# Patient Record
Sex: Male | Born: 1960 | Race: Black or African American | Hispanic: No | Marital: Single | Smoking: Never smoker
Health system: Southern US, Community
[De-identification: ages and names within clinical notes are randomized; demographics above are authoritative.]

## PROBLEM LIST (undated history)

## (undated) DIAGNOSIS — I10 Essential (primary) hypertension: Secondary | ICD-10-CM

## (undated) DIAGNOSIS — J45909 Unspecified asthma, uncomplicated: Secondary | ICD-10-CM

---

## 2008-01-20 ENCOUNTER — Emergency Department (HOSPITAL_COMMUNITY): Admission: EM | Admit: 2008-01-20 | Discharge: 2008-01-20 | Payer: Self-pay | Admitting: Family Medicine

## 2008-03-15 ENCOUNTER — Emergency Department (HOSPITAL_COMMUNITY): Admission: EM | Admit: 2008-03-15 | Discharge: 2008-03-15 | Payer: Self-pay | Admitting: Emergency Medicine

## 2009-03-08 ENCOUNTER — Emergency Department (HOSPITAL_COMMUNITY): Admission: EM | Admit: 2009-03-08 | Discharge: 2009-03-08 | Payer: Self-pay | Admitting: Family Medicine

## 2013-01-12 ENCOUNTER — Encounter (HOSPITAL_COMMUNITY): Payer: Self-pay | Admitting: Emergency Medicine

## 2013-01-12 ENCOUNTER — Emergency Department (HOSPITAL_COMMUNITY)
Admission: EM | Admit: 2013-01-12 | Discharge: 2013-01-12 | Disposition: A | Discharge status: Home / Self Care | Payer: Self-pay | Attending: Emergency Medicine | Admitting: Emergency Medicine

## 2013-01-12 ENCOUNTER — Emergency Department (HOSPITAL_COMMUNITY): Payer: Self-pay

## 2013-01-12 DIAGNOSIS — I10 Essential (primary) hypertension: Secondary | ICD-10-CM | POA: Insufficient documentation

## 2013-01-12 DIAGNOSIS — R0602 Shortness of breath: Secondary | ICD-10-CM

## 2013-01-12 DIAGNOSIS — R9431 Abnormal electrocardiogram [ECG] [EKG]: Secondary | ICD-10-CM

## 2013-01-12 DIAGNOSIS — R05 Cough: Secondary | ICD-10-CM | POA: Insufficient documentation

## 2013-01-12 DIAGNOSIS — R059 Cough, unspecified: Secondary | ICD-10-CM | POA: Insufficient documentation

## 2013-01-12 DIAGNOSIS — J45901 Unspecified asthma with (acute) exacerbation: Secondary | ICD-10-CM | POA: Insufficient documentation

## 2013-01-12 DIAGNOSIS — I517 Cardiomegaly: Secondary | ICD-10-CM

## 2013-01-12 HISTORY — DX: Essential (primary) hypertension: I10

## 2013-01-12 HISTORY — DX: Unspecified asthma, uncomplicated: J45.909

## 2013-01-12 LAB — CBC WITH DIFFERENTIAL/PLATELET
Eosinophils Absolute: 0.2 10*3/uL (ref 0.0–0.7)
Eosinophils Relative: 3 % (ref 0–5)
HCT: 44.7 % (ref 39.0–52.0)
Hemoglobin: 15.6 g/dL (ref 13.0–17.0)
Lymphs Abs: 1.6 10*3/uL (ref 0.7–4.0)
MCH: 29.8 pg (ref 26.0–34.0)
MCHC: 34.9 g/dL (ref 30.0–36.0)
MCV: 85.5 fL (ref 78.0–100.0)
Monocytes Absolute: 0.5 10*3/uL (ref 0.1–1.0)
Monocytes Relative: 5 % (ref 3–12)
RBC: 5.23 MIL/uL (ref 4.22–5.81)

## 2013-01-12 LAB — BASIC METABOLIC PANEL
BUN: 13 mg/dL (ref 6–23)
Calcium: 10 mg/dL (ref 8.4–10.5)
Creatinine, Ser: 0.91 mg/dL (ref 0.50–1.35)
GFR calc non Af Amer: >90 mL/min (ref >90)
Glucose, Bld: 96 mg/dL (ref 70–99)
Potassium: 3.5 mEq/L (ref 3.5–5.1)

## 2013-01-12 LAB — POCT I-STAT TROPONIN I

## 2013-01-12 MED ORDER — ASPIRIN 81 MG PO CHEW
324.0000 mg | CHEWABLE_TABLET | Freq: Once | ORAL | Status: AC
  Administered 2013-01-12: 324 mg via ORAL
  Filled 2013-01-12: qty 4

## 2013-01-12 MED ORDER — ALBUTEROL SULFATE (5 MG/ML) 0.5% IN NEBU
2.5000 mg | INHALATION_SOLUTION | Freq: Once | RESPIRATORY_TRACT | Status: AC
  Administered 2013-01-12: 2.5 mg via RESPIRATORY_TRACT
  Filled 2013-01-12: qty 0.5

## 2013-01-12 MED ORDER — ALBUTEROL SULFATE HFA 108 (90 BASE) MCG/ACT IN AERS
2.0000 | INHALATION_SPRAY | Freq: Once | RESPIRATORY_TRACT | Status: AC
  Administered 2013-01-12: 2 via RESPIRATORY_TRACT
  Filled 2013-01-12: qty 6.7

## 2013-01-12 MED ORDER — METHYLPREDNISOLONE SODIUM SUCC 125 MG IJ SOLR
125.0000 mg | Freq: Once | INTRAMUSCULAR | Status: AC
  Administered 2013-01-12: 125 mg via INTRAMUSCULAR

## 2013-01-12 MED ORDER — METHYLPREDNISOLONE SODIUM SUCC 125 MG IJ SOLR
125.0000 mg | Freq: Once | INTRAMUSCULAR | Status: DC
  Filled 2013-01-12: qty 2

## 2013-01-12 MED ORDER — NITROGLYCERIN 0.4 MG SL SUBL
0.4000 mg | SUBLINGUAL_TABLET | SUBLINGUAL | Status: DC | PRN
  Filled 2013-01-12: qty 25

## 2013-01-12 MED ORDER — PREDNISONE 20 MG PO TABS: 40.0000 mg | ORAL_TABLET | Freq: Every day | ORAL | Status: AC

## 2013-01-12 NOTE — ED Notes (Signed)
Patient reports slow improvement of the SOB after the nebulizing treatment.

## 2013-01-12 NOTE — ED Provider Notes (Signed)
CSN: [id number]     Arrival date & time 01/12/13  1478 History     First MD Initiated Contact with Patient 01/12/13 0827     Chief Complaint  Patient presents with  . Shortness of Breath   (Consider location/radiation/quality/duration/timing/severity/associated sxs/prior Treatment) HPI Comments: Patient is a 52 year old male with a past medical history of asthma and hypertension who presents with a 3 day history of SOB. Symptoms started gradually and remained intermittent since the onset. Patient denies any known trigger for the SOB. He tried his albuterol inhaler at home for the SOB which initially provided relief and then no longer helped with his symptoms after subsequent uses. Patient reports associated dry cough. No aggravating/alleviating factors. Patient denies recent surgery, recent travel, history of DVT/PE. Patient denies chest pain or any other associated symptoms.    Past Medical History  Diagnosis Date  . Asthma   . Hypertension    History reviewed. No pertinent past surgical history. History reviewed. No pertinent family history. History  Substance Use Topics  . Smoking status: Never Smoker   . Smokeless tobacco: Not on file  . Alcohol Use: Yes     Comment: occ    Review of Systems  Respiratory: Positive for cough and shortness of breath.   All other systems reviewed and are negative.    Allergies  Review of patient's allergies indicates no known allergies.  Home Medications  No current outpatient prescriptions on file. BP 160/79  Pulse 95  Temp(Src) 98.1 F (36.7 C) (Oral)  Resp 18  SpO2 96% Physical Exam  Nursing note and vitals reviewed. Constitutional: He is oriented to person, place, and time. He appears well-developed and well-nourished. No distress.  HENT:  Head: Normocephalic and atraumatic.  Eyes: Conjunctivae and EOM are normal.  Neck: Normal range of motion.  Cardiovascular: Normal rate and regular rhythm.  Exam reveals no gallop and no  friction rub.   No murmur heard. Pulmonary/Chest: Effort normal and breath sounds normal. He has no wheezes. He has no rales. He exhibits no tenderness.  Abdominal: Soft. He exhibits no distension. There is no tenderness. There is no rebound and no guarding.  Musculoskeletal: Normal range of motion.  Neurological: He is alert and oriented to person, place, and time. Coordination normal.  Speech is goal-oriented. Moves limbs without ataxia.   Skin: Skin is warm and dry.  Psychiatric: He has a normal mood and affect. His behavior is normal.    ED Course   Procedures (including critical care time)   Date: 01/12/2013  Rate: 96  Rhythm: normal sinus rhythm  QRS Axis: normal  Intervals: normal  ST/T Wave abnormalities: nonspecific ST/T changes  Conduction Disutrbances:none  Narrative Interpretation: NSR with ST/T wave changes noted  Old EKG Reviewed: none available   Labs Reviewed  CBC WITH DIFFERENTIAL  BASIC METABOLIC PANEL  D-DIMER, QUANTITATIVE  POCT I-STAT TROPONIN I   Dg Chest 2 View  01/12/2013   *RADIOLOGY REPORT*  Clinical Data: Shortness of breath  CHEST - 2 VIEW  Comparison: None.  Findings:  Lungs are clear.  Heart size and pulmonary vascularity are normal.  No adenopathy.  No bone lesions.  IMPRESSION: No abnormality noted.   Original Report Authenticated By: Bretta Bang, M.D.   1. SOB (shortness of breath)   2. LVH (left ventricular hypertrophy)     MDM  8:33 AM Labs, chest xray, d-dimer pending. Patient will have ASA and nitro here. Patient will also have albuterol nebulizer. Vitals stable  and patient afebrile.   11:40 AM Chest xray, d-dimer, troponin unremarkable. Patient will have albuterol inhaler here to take home and solumedrol injection. Patient has EKG abnormalities that are concerning for ischemia such as ST/T wave changes. I spoke with [facility] who will see the patient.   1:08 PM Dr. Elease Hashimoto saw the patient who thinks the SOB is asthma  related. Dr. Nehemiah Settle faxed over an old EKG from the office which looks the same as the EKG now. I will discharge the patient with prednisone pack and instructions to follow up with Dr. Nehemiah Settle. Vitals stable and patient afebrile. Patient asymptomatic at this time.     Emilia Beck, PA-C 01/12/13 1311

## 2013-01-12 NOTE — ED Notes (Signed)
Patient said he started having SOB on Sunday but it was not bad.  It has gotten progressively worse so he decided to come in.  The patient does have asthma, however, he advised that he has not used his inhaler in a year.  He says he has had to use it since Sunday.  Patient denies productive cough or any pain.

## 2013-01-12 NOTE — ED Notes (Signed)
Patient is alert and orientedx4.  Patient was explained discharge instructions and they understood them with no questions.   

## 2013-01-12 NOTE — Consult Note (Signed)
CONSULT NOTE  Date: 01/12/2013               Patient Name:  Tony Glenn MRN: [id number]  DOB: 03/31/1961 Age / Sex: 52 y.o., male        PCP: No primary provider on file. Primary Cardiologist: New to [place]            Referring Physician: Eden Emms, MD    Primary MD is Ron Nehemiah Settle          Reason for Consult: Abnormal ECG, dyspnea.           History of Present Illness: Patient is a 52 y.o. male with a PMHx of asthma and hypertension, who was seen in the [facility] emergency room on 01/12/2013 for evaluation of shortness of breath and abnormal EKG.  He has been told that his ECG suggests that he has LVH.  Pt has been on HCTZ for HTN for 3 years ago.  He take asthma inhalers PRN.   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    No chest pain.  He does notice some pleuretic discomfort that he noticed today.  Exercises regularly, weights and walking.  No CP with exercise.  He has received several inhalers, and IV solumedrol and is better but not 100%,    No family hx of cardiac problems.  Non smoker  Drinks ETOH occasionally.    Medications: Outpatient medications:  (Not in a hospital admission)  Current medications: No current facility-administered medications for this encounter.   Current Outpatient Prescriptions  Medication Sig Dispense Refill  . albuterol (PROVENTIL HFA;VENTOLIN HFA) 108 (90 BASE) MCG/ACT inhaler Inhale 2 puffs into the lungs every 6 (six) hours as needed for wheezing.      . fish oil-omega-3 fatty acids 1000 MG capsule Take 2 g by mouth daily.      . hydrochlorothiazide (HYDRODIURIL) 25 MG tablet Take 25 mg by mouth daily.      . Multiple Vitamins-Minerals (MULTIVITAMIN WITH MINERALS) tablet Take 1 tablet by mouth daily.      Marland Kitchen SALINE NA Place 2 sprays into both nostrils as needed (congestion).         No Known Allergies   Past Medical History  Diagnosis  Date  . Asthma   . Hypertension     History reviewed. No pertinent past surgical history.  History reviewed. No pertinent family history.  Social History:  reports that he has never smoked. He does not have any smokeless tobacco history on file. He reports that  drinks alcohol. He reports that he does not use illicit drugs.   Review of Systems: Constitutional:  denies fever, chills, diaphoresis, appetite change and fatigue.  HEENT: admits to  Cough and sneezing starting yesterday,  Denies any neck stiffness and tinnitus.  Respiratory: admits to SOB, DOE, cough, chest tightness, and wheezing.  Cardiovascular: denies chest pain, palpitations and leg swelling.  Gastrointestinal: denies nausea, vomiting, abdominal pain, diarrhea, constipation, blood in stool.  Genitourinary: denies dysuria, urgency, frequency, hematuria, flank pain and difficulty urinating.  He does notice some increased urination after taking his HCTZ  Musculoskeletal: denies  myalgias, back pain, joint swelling, arthralgias and gait problem.   Skin: denies pallor, rash and wound.  Neurological: denies dizziness, seizures, syncope, weakness, light-headedness, numbness and headaches.   Hematological: denies adenopathy, easy bruising, personal or family bleeding history.  Psychiatric/ Behavioral: denies  suicidal ideation, mood changes, confusion, nervousness, sleep disturbance and agitation.    Physical Exam: BP 101/77  Pulse 82  Temp(Src) 98.1 F (36.7 C) (Oral)  Resp 13  SpO2 97%  General: Vital signs reviewed and noted. Well-developed, well-nourished, in no acute distress; alert, appropriate and cooperative throughout examination.  Head: Normocephalic, atraumatic, sclera anicteric, mucus membranes are moist  Neck: Supple. Negative for carotid bruits. JVD not elevated.  Lungs:  Mild wheezes  Heart: RRR with S1 S2. No murmurs, rubs, or gallops appreciated.  Abdomen:  Soft, non-tender, non-distended with  normoactive bowel sounds. No hepatomegaly. No rebound/guarding. No obvious abdominal masses  MSK: Strength and the appear normal for age.  Extremities: No clubbing or cyanosis. No edema.  Distal pedal pulses are 2+ and equal bilaterally.  Neurologic: Alert and oriented X 3. Moves all extremities spontaneously.  Psych: Responds to questions appropriately with a normal affect.    Lab results: Basic Metabolic Panel:  Recent Labs Lab 01/12/13 1000  NA 139  K 3.5  CL 103  CO2 25  GLUCOSE 96  BUN 13  CREATININE 0.91  CALCIUM 10.0    Liver Function Tests: No results found for this basename: AST, ALT, ALKPHOS, BILITOT, PROT, ALBUMIN,  in the last 168 hours No results found for this basename: LIPASE, AMYLASE,  in the last 168 hours No results found for this basename: AMMONIA,  in the last 168 hours  CBC:  Recent Labs Lab 01/12/13 1000  WBC 8.6  NEUTROABS 6.3  HGB 15.6  HCT 44.7  MCV 85.5  PLT 241    Cardiac Enzymes: No results found for this basename: CKTOTAL, CKMB, CKMBINDEX, TROPONINI,  in the last 168 hours  BNP: No components found with this basename: POCBNP,   CBG: No results found for this basename: GLUCAP,  in the last 168 hours  Coagulation Studies: No results found for this basename: LABPROT, INR,  in the last 72 hours   Other results:  EKG: Normal sinus rhythm at 96 beats a minute. He has voltage criteria for left ventricular hypertrophy with repolarization and 90. The old EKG from Dr. Warnell Forester office reveals similar abnormalities   Imaging: Dg Chest 2 View  01/12/2013   *RADIOLOGY REPORT*  Clinical Data: Shortness of breath  CHEST - 2 VIEW  Comparison: None.  Findings:  Lungs are clear.  Heart size and pulmonary vascularity are normal.  No adenopathy.  No bone lesions.  IMPRESSION: No abnormality noted.   Original Report Authenticated By: Bretta Bang, M.D.      Assessment & Plan: 1. Abnormal EKG during her as not having any significant episodes  of chest pain. He does have shortness of breath due to asthma. His EKG is suggestive of left ventricular hypertrophy with repolarization irregularities.  The old ECG from Dr. Lynnette Caffey office shows similar changes.   I suspect that this had hypertension for quite some time.  At present is not having any symptoms of unstable angina leading to his current complaints of dyspnea or just due to his asthma.  History total level is negative despite having symptoms since this past weekend.  This but I think that he is stable for discharge. We will provide him my name and office number. I've encouraged him to call me if he has any change in his symptoms or if he develops episodes of chest discomfort with it exertion.  At present he is able to exercise on a regular basis and has never had any episodes of chest pain.  I discussed the case with Dr. Nehemiah Settle. He'll be happy to see him in followup.    Vesta Mixer, Montez Hageman., MD, [facility] 01/12/2013, 12:48 PM Office - [phone] Pager [phone]

## 2013-01-12 NOTE — ED Notes (Signed)
Pt. Currently in XRAY, plan to give aerosol as soon as returned.

## 2013-01-12 NOTE — ED Notes (Signed)
Patient is resting comfortably. 

## 2013-01-12 NOTE — ED Notes (Signed)
Patient transported to X-ray 

## 2013-01-12 NOTE — ED Notes (Signed)
Pt. given 2 puffs from Albuterol Inhaler, one with spacer per order and educated along with exit care handouts about use of both.

## 2013-01-12 NOTE — ED Notes (Signed)
Pt c/o intermittent SOB x 3 days; pt sts some cough

## 2013-01-13 NOTE — ED Provider Notes (Signed)
Medical screening examination/treatment/procedure(s) were performed by non-physician practitioner and as supervising physician I was immediately available for consultation/collaboration.   Akaylah Lalley M Gennesis Hogland, DO 01/13/13 2219 

## 2013-01-20 ENCOUNTER — Emergency Department (HOSPITAL_COMMUNITY)
Admission: EM | Admit: 2013-01-20 | Discharge: 2013-01-20 | Disposition: A | Discharge status: Home / Self Care | Payer: Self-pay | Attending: Emergency Medicine | Admitting: Emergency Medicine

## 2013-01-20 ENCOUNTER — Encounter (HOSPITAL_COMMUNITY): Payer: Self-pay | Admitting: Emergency Medicine

## 2013-01-20 DIAGNOSIS — J45901 Unspecified asthma with (acute) exacerbation: Secondary | ICD-10-CM | POA: Insufficient documentation

## 2013-01-20 DIAGNOSIS — R059 Cough, unspecified: Secondary | ICD-10-CM | POA: Insufficient documentation

## 2013-01-20 DIAGNOSIS — J45909 Unspecified asthma, uncomplicated: Secondary | ICD-10-CM

## 2013-01-20 DIAGNOSIS — Z79899 Other long term (current) drug therapy: Secondary | ICD-10-CM | POA: Insufficient documentation

## 2013-01-20 DIAGNOSIS — I10 Essential (primary) hypertension: Secondary | ICD-10-CM | POA: Insufficient documentation

## 2013-01-20 DIAGNOSIS — R05 Cough: Secondary | ICD-10-CM | POA: Insufficient documentation

## 2013-01-20 MED ORDER — ALBUTEROL SULFATE (5 MG/ML) 0.5% IN NEBU
5.0000 mg | INHALATION_SOLUTION | Freq: Once | RESPIRATORY_TRACT | Status: AC
  Administered 2013-01-20: 5 mg via RESPIRATORY_TRACT
  Filled 2013-01-20: qty 1

## 2013-01-20 MED ORDER — ALBUTEROL SULFATE HFA 108 (90 BASE) MCG/ACT IN AERS
2.0000 | INHALATION_SPRAY | RESPIRATORY_TRACT | Status: DC | PRN
  Administered 2013-01-20: 2 via RESPIRATORY_TRACT
  Filled 2013-01-20: qty 6.7

## 2013-01-20 NOTE — ED Provider Notes (Signed)
CSN: [id number]     Arrival date & time 01/20/13  0112 History     First MD Initiated Contact with Patient 01/20/13 [phone]     Chief Complaint  Patient presents with  . Shortness of Breath  . Cough   (Consider location/radiation/quality/duration/timing/severity/associated sxs/prior Treatment) Patient is a 52 y.o. male presenting with shortness of breath and cough.  Shortness of Breath Associated symptoms: cough   Cough Associated symptoms: shortness of breath    Pt with remote history of asthma has had about a week of SOB and wheezing. He was seen in the ED 6 days ago for same and had extensive workup including labs, CXR, EKG etc. Was given HFA and steroids with good improvement until yesterday when he was at work in a dusty, moldy environment and felt more wheezing and tightness. He was given a neb in triage and feeling much better now. States he is almost out of HFA given at recent visit. Also has finished his steroid course.  Past Medical History  Diagnosis Date  . Asthma   . Hypertension    History reviewed. No pertinent past surgical history. No family history on file. History  Substance Use Topics  . Smoking status: Never Smoker   . Smokeless tobacco: Not on file  . Alcohol Use: Yes     Comment: occ    Review of Systems  Respiratory: Positive for cough and shortness of breath.    All other systems reviewed and are negative except as noted in HPI.   Allergies  Review of patient's allergies indicates no known allergies.  Home Medications   Current Outpatient Rx  Name  Route  Sig  Dispense  Refill  . albuterol (PROVENTIL HFA;VENTOLIN HFA) 108 (90 BASE) MCG/ACT inhaler   Inhalation   Inhale 2 puffs into the lungs every 6 (six) hours as needed for wheezing.         . fish oil-omega-3 fatty acids 1000 MG capsule   Oral   Take 2 g by mouth daily.         . hydrochlorothiazide (HYDRODIURIL) 25 MG tablet   Oral   Take 25 mg by mouth daily.         . Multiple  Vitamins-Minerals (MULTIVITAMIN WITH MINERALS) tablet   Oral   Take 1 tablet by mouth daily.         . sodium chloride (OCEAN) 0.65 % nasal spray   Nasal   Place 2 sprays into the nose as needed for congestion.         . predniSONE (DELTASONE) 20 MG tablet   Oral   Take 2 tablets (40 mg total) by mouth daily.   10 tablet   0    BP 130/88  Pulse 70  Temp(Src) 98.2 F (36.8 C) (Oral)  Resp 18  SpO2 95% Physical Exam  Nursing note and vitals reviewed. Constitutional: He is oriented to person, place, and time. He appears well-developed and well-nourished.  HENT:  Head: Normocephalic and atraumatic.  Eyes: EOM are normal. Pupils are equal, round, and reactive to light.  Neck: Normal range of motion. Neck supple.  Cardiovascular: Normal rate, normal heart sounds and intact distal pulses.   Pulmonary/Chest: Effort normal and breath sounds normal.  Abdominal: Bowel sounds are normal. He exhibits no distension. There is no tenderness.  Musculoskeletal: Normal range of motion. He exhibits no edema and no tenderness.  Neurological: He is alert and oriented to person, place, and time. He has normal strength. No  cranial nerve deficit or sensory deficit.  Skin: Skin is warm and dry. No rash noted.  Psychiatric: He has a normal mood and affect.    ED Course   Procedures (including critical care time)  Labs Reviewed - No data to display No results found. 1. Asthma     MDM  Pt states he was working a temporary job but will not be going back due to dust. Will refill HFA but no indication for another steroid course.   Charles B. Bernette Mayers, MD 01/20/13 8756

## 2013-01-20 NOTE — ED Notes (Signed)
Dr. Bernette Mayers at bedside to see patient.

## 2013-01-20 NOTE — ED Notes (Signed)
PT. REPORTS SOB WITH PRODUCTIVE COUGH FOR SEVERAL DAYS PRESCRIBED LWITH PREDNISONE AND ALBUTEROL INHALER LAST Tuesday FOR HIS ASTHMA ATTACK .

## 2014-08-24 ENCOUNTER — Encounter (HOSPITAL_COMMUNITY): Payer: Self-pay | Admitting: Emergency Medicine

## 2014-08-24 ENCOUNTER — Emergency Department (HOSPITAL_COMMUNITY)
Admission: EM | Admit: 2014-08-24 | Discharge: 2014-08-24 | Disposition: A | Discharge status: Home / Self Care | Payer: Managed Care, Other (non HMO) | Attending: Emergency Medicine | Admitting: Emergency Medicine

## 2014-08-24 ENCOUNTER — Emergency Department (HOSPITAL_COMMUNITY): Payer: Managed Care, Other (non HMO)

## 2014-08-24 DIAGNOSIS — R059 Cough, unspecified: Secondary | ICD-10-CM

## 2014-08-24 DIAGNOSIS — J069 Acute upper respiratory infection, unspecified: Secondary | ICD-10-CM | POA: Diagnosis not present

## 2014-08-24 DIAGNOSIS — Z79899 Other long term (current) drug therapy: Secondary | ICD-10-CM | POA: Insufficient documentation

## 2014-08-24 DIAGNOSIS — J45909 Unspecified asthma, uncomplicated: Secondary | ICD-10-CM | POA: Diagnosis not present

## 2014-08-24 DIAGNOSIS — R05 Cough: Secondary | ICD-10-CM | POA: Diagnosis present

## 2014-08-24 DIAGNOSIS — I10 Essential (primary) hypertension: Secondary | ICD-10-CM | POA: Diagnosis not present

## 2014-08-24 DIAGNOSIS — Z7952 Long term (current) use of systemic steroids: Secondary | ICD-10-CM | POA: Insufficient documentation

## 2014-08-24 MED ORDER — GUAIFENESIN-CODEINE 100-10 MG/5ML PO SOLN: 5.0000 mL | Freq: Four times a day (QID) | ORAL | Status: AC | PRN

## 2014-08-24 MED ORDER — BENZONATATE 100 MG PO CAPS: 100.0000 mg | ORAL_CAPSULE | Freq: Three times a day (TID) | ORAL | Status: AC

## 2014-08-24 MED ORDER — BENZONATATE 100 MG PO CAPS
100.0000 mg | ORAL_CAPSULE | Freq: Once | ORAL | Status: AC
  Administered 2014-08-24: 100 mg via ORAL
  Filled 2014-08-24: qty 1

## 2014-08-24 NOTE — ED Provider Notes (Signed)
CSN: [id number]     Arrival date & time 08/24/14  7829 History   First MD Initiated Contact with Patient 08/24/14 0759     Chief Complaint  Patient presents with  . Cough     (Consider location/radiation/quality/duration/timing/severity/associated sxs/prior Treatment) HPI   Tony Glenn is a 54 year old male presenting to the ED with a chief complaint of cough.  He states that this cough started on Friday or Saturday.  He is producing thick, white-yellow phlegm.  He states that when he woke up this morning he coughed so much that he threw up phlegm.  He also complains of a rattle when breathing. The patient has a history of asthma and is a non-smoker.  He has some tightness in his chest that improves with the use of his inhaler, and some sinus drainage.  He denies fever, sore throat, chest pain, abdominal pain, SOB, myalgias, neck pain, headaches, vomiting or diarrhea.  He was sick with a cough and sore throat about a month ago and was seen at urgent care, no chest xray was done, he was medicated with abx and prednisone, and improved.  He was well for about a week before this episode began.  He states that people at his work have been sick recently.      Past Medical History  Diagnosis Date  . Asthma   . Hypertension    History reviewed. No pertinent past surgical history. No family history on file. History  Substance Use Topics  . Smoking status: Never Smoker   . Smokeless tobacco: Not on file  . Alcohol Use: Yes     Comment: occ    Review of Systems 10 Systems reviewed and are negative for acute change except as noted in the HPI.    Allergies  Peanuts  Home Medications   Prior to Admission medications   Medication Sig Start Date End Date Taking? Authorizing Provider  albuterol (PROVENTIL HFA;VENTOLIN HFA) 108 (90 BASE) MCG/ACT inhaler Inhale 2 puffs into the lungs every 6 (six) hours as needed for wheezing.    Historical Provider, MD  benzonatate (TESSALON) 100 MG capsule Take 1  capsule (100 mg total) by mouth every 8 (eight) hours. 08/24/14   Marlon Pel, PA-C  fish oil-omega-3 fatty acids 1000 MG capsule Take 2 g by mouth daily.    Historical Provider, MD  guaiFENesin-codeine 100-10 MG/5ML syrup Take 5-10 mLs by mouth every 6 (six) hours as needed for cough. 08/24/14   Akili Cuda Neva Seat, PA-C  hydrochlorothiazide (HYDRODIURIL) 25 MG tablet Take 25 mg by mouth daily.    Historical Provider, MD  Multiple Vitamins-Minerals (MULTIVITAMIN WITH MINERALS) tablet Take 1 tablet by mouth daily.    Historical Provider, MD  predniSONE (DELTASONE) 20 MG tablet Take 2 tablets (40 mg total) by mouth daily. 01/12/13   Kaitlyn Szekalski, PA-C  sodium chloride (OCEAN) 0.65 % nasal spray Place 2 sprays into the nose as needed for congestion.    Historical Provider, MD   BP 130/79 mmHg  Pulse 90  Temp(Src) 98.1 F (36.7 C) (Oral)  Resp 20  SpO2 96% Physical Exam  Constitutional: He appears well-developed and well-nourished. No distress.  HENT:  Head: Normocephalic and atraumatic.  Right Ear: Tympanic membrane and ear canal normal.  Left Ear: Tympanic membrane and ear canal normal.  Nose: Rhinorrhea present. Right sinus exhibits no maxillary sinus tenderness and no frontal sinus tenderness. Left sinus exhibits no maxillary sinus tenderness and no frontal sinus tenderness.  Mouth/Throat: Uvula is midline, oropharynx  is clear and moist and mucous membranes are normal.  Eyes: Pupils are equal, round, and reactive to light.  Neck: Normal range of motion. Neck supple.  Cardiovascular: Normal rate and regular rhythm.   Pulmonary/Chest: Effort normal. No accessory muscle usage. No respiratory distress. He has no decreased breath sounds. He has no wheezes. He has no rhonchi. He has no rales.  Clear airways without wheezing.  Abdominal: Soft.  Neurological: He is alert.  Skin: Skin is warm and dry.  Nursing note and vitals reviewed.   ED Course  Procedures (including critical care  time) Labs Review Labs Reviewed - No data to display  Imaging Review Dg Chest 2 View  08/24/2014   CLINICAL DATA:  54 year old male with cough for 6 days. Shortness breath. Upper back pain. History of asthma and hypertension. Nonsmoker. Initial encounter.  EXAM: CHEST  2 VIEW  COMPARISON:  01/12/2013.  FINDINGS: No infiltrate, congestive heart failure or pneumothorax.  Heart size top-normal.  No plain film evidence of pulmonary malignancy.  No osseous abnormality noted.  IMPRESSION: No active cardiopulmonary disease.   Electronically Signed   By: Lacy DuverneySteven  Olson M.D.   On: 08/24/2014 08:39     EKG Interpretation None      MDM   Final diagnoses:  Cough  URI (upper respiratory infection)   Normal vital signs, no SOB or CP, weakness, headaches, neck pain.  He has no weakness, declines breathing treatment. His chest xray shows no pneumonia, bronchitis, CHF or other associated abnormality. We discussed abx vs symptomatic treatment. At this time he is afebrile, over all feeling well and has only had cough for a couple of days. I will treat with cough medication and have him follow-up with his PCP for recheck. He is comfortable with this. He understands importance of recheck as he could potentially develop bronchitis or pneumonia in the future.  54 y.o.Tony Glenn's evaluation in the Emergency Department is complete. It has been determined that no acute conditions requiring further emergency intervention are present at this time. The patient/guardian have been advised of the diagnosis and plan. We have discussed signs and symptoms that warrant return to the ED, such as changes or worsening in symptoms.  Vital signs are stable at discharge. Filed Vitals:   08/24/14 0815  BP: 130/79  Pulse: 90  Temp:   Resp:     Patient/guardian has voiced understanding and agreed to follow-up with the PCP or specialist.    Marlon Peliffany Zea Kostka, PA-C 08/24/14 [id number]  Mancel BaleElliott Wentz, MD 08/25/14 1421

## 2014-08-24 NOTE — Discharge Instructions (Signed)
Cough, Adult  A cough is a reflex that helps clear your throat and airways. It can help heal the body or may be a reaction to an irritated airway. A cough may only last 2 or 3 weeks (acute) or may last more than 8 weeks (chronic).  CAUSES Acute cough:  Viral or bacterial infections. Chronic cough:  Infections.  Allergies.  Asthma.  Post-nasal drip.  Smoking.  Heartburn or acid reflux.  Some medicines.  Chronic lung problems (COPD).  Cancer. SYMPTOMS   Cough.  Fever.  Chest pain.  Increased breathing rate.  High-pitched whistling sound when breathing (wheezing).  Colored mucus that you cough up (sputum). TREATMENT   A bacterial cough may be treated with antibiotic medicine.  A viral cough must run its course and will not respond to antibiotics.  Your caregiver may recommend other treatments if you have a chronic cough. HOME CARE INSTRUCTIONS   Only take over-the-counter or prescription medicines for pain, discomfort, or fever as directed by your caregiver. Use cough suppressants only as directed by your caregiver.  Use a cold steam vaporizer or humidifier in your bedroom or home to help loosen secretions.  Sleep in a semi-upright position if your cough is worse at night.  Rest as needed.  Stop smoking if you smoke. SEEK IMMEDIATE MEDICAL CARE IF:   You have pus in your sputum.  Your cough starts to worsen.  You cannot control your cough with suppressants and are losing sleep.  You begin coughing up blood.  You have difficulty breathing.  You develop pain which is getting worse or is uncontrolled with medicine.  You have a fever. MAKE SURE YOU:   Understand these instructions.  Will watch your condition.  Will get help right away if you are not doing well or get worse. Document Released: 11/16/2010 Document Revised: 08/12/2011 Document Reviewed: 11/16/2010 [employer] Patient Information 2015 [employer]. This information is not intended  to replace advice given to you by your health care provider. Make sure you discuss any questions you have with your health care provider. Asthma Asthma is a recurring condition in which the airways tighten and narrow. Asthma can make it difficult to breathe. It can cause coughing, wheezing, and shortness of breath. Asthma episodes, also called asthma attacks, range from minor to life-threatening. Asthma cannot be cured, but medicines and lifestyle changes can help control it. CAUSES Asthma is believed to be caused by inherited (genetic) and environmental factors, but its exact cause is unknown. Asthma may be triggered by allergens, lung infections, or irritants in the air. Asthma triggers are different for each person. Common triggers include:   Animal dander.  Dust mites.  Cockroaches.  Pollen from trees or grass.  Mold.  Smoke.  Air pollutants such as dust, household cleaners, hair sprays, aerosol sprays, paint fumes, strong chemicals, or strong odors.  Cold air, weather changes, and winds (which increase molds and pollens in the air).  Strong emotional expressions such as crying or laughing hard.  Stress.  Certain medicines (such as aspirin) or types of drugs (such as beta-blockers).  Sulfites in foods and drinks. Foods and drinks that may contain sulfites include dried fruit, potato chips, and sparkling grape juice.  Infections or inflammatory conditions such as the flu, a cold, or an inflammation of the nasal membranes (rhinitis).  Gastroesophageal reflux disease (GERD).  Exercise or strenuous activity. SYMPTOMS Symptoms may occur immediately after asthma is triggered or many hours later. Symptoms include:  Wheezing.  Excessive nighttime or   early morning coughing.  Frequent or severe coughing with a common cold.  Chest tightness.  Shortness of breath. DIAGNOSIS  The diagnosis of asthma is made by a review of your medical history and a physical exam. Tests may also  be performed. These may include:  Lung function studies. These tests show how much air you breathe in and out.  Allergy tests.  Imaging tests such as X-rays. TREATMENT  Asthma cannot be cured, but it can usually be controlled. Treatment involves identifying and avoiding your asthma triggers. It also involves medicines. There are 2 classes of medicine used for asthma treatment:   Controller medicines. These prevent asthma symptoms from occurring. They are usually taken every day.  Reliever or rescue medicines. These quickly relieve asthma symptoms. They are used as needed and provide short-term relief. Your health care provider will help you create an asthma action plan. An asthma action plan is a written plan for managing and treating your asthma attacks. It includes a list of your asthma triggers and how they may be avoided. It also includes information on when medicines should be taken and when their dosage should be changed. An action plan may also involve the use of a device called a peak flow meter. A peak flow meter measures how well the lungs are working. It helps you monitor your condition. HOME CARE INSTRUCTIONS   Take medicines only as directed by your health care provider. Speak with your health care provider if you have questions about how or when to take the medicines.  Use a peak flow meter as directed by your health care provider. Record and keep track of readings.  Understand and use the action plan to help minimize or stop an asthma attack without needing to seek medical care.  Control your home environment in the following ways to help prevent asthma attacks:  Do not smoke. Avoid being exposed to secondhand smoke.  Change your heating and air conditioning filter regularly.  Limit your use of fireplaces and wood stoves.  Get rid of pests (such as roaches and mice) and their droppings.  Throw away plants if you see mold on them.  Clean your floors and dust regularly.  Use unscented cleaning products.  Try to have someone else vacuum for you regularly. Stay out of rooms while they are being vacuumed and for a short while afterward. If you vacuum, use a dust mask from a hardware store, a double-layered or microfilter vacuum cleaner bag, or a vacuum cleaner with a HEPA filter.  Replace carpet with wood, tile, or vinyl flooring. Carpet can trap dander and dust.  Use allergy-proof pillows, mattress covers, and box spring covers.  Wash bed sheets and blankets every week in hot water and dry them in a dryer.  Use blankets that are made of polyester or cotton.  Clean bathrooms and kitchens with bleach. If possible, have someone repaint the walls in these rooms with mold-resistant paint. Keep out of the rooms that are being cleaned and painted.  Wash hands frequently. SEEK MEDICAL CARE IF:   You have wheezing, shortness of breath, or a cough even if taking medicine to prevent attacks.  The colored mucus you cough up (sputum) is thicker than usual.  Your sputum changes from clear or white to yellow, green, gray, or bloody.  You have any problems that may be related to the medicines you are taking (such as a rash, itching, swelling, or trouble breathing).  You are using a reliever medicine more than   2-3 times per week.  Your peak flow is still at 50-79% of your personal best after following your action plan for 1 hour.  You have a fever. SEEK IMMEDIATE MEDICAL CARE IF:   You seem to be getting worse and are unresponsive to treatment during an asthma attack.  You are short of breath even at rest.  You get short of breath when doing very little physical activity.  You have difficulty eating, drinking, or talking due to asthma symptoms.  You develop chest pain.  You develop a fast heartbeat.  You have a bluish color to your lips or fingernails.  You are light-headed, dizzy, or faint.  Your peak flow is less than 50% of your personal best. MAKE  SURE YOU:   Understand these instructions.  Will watch your condition.  Will get help right away if you are not doing well or get worse. Document Released: 05/20/2005 Document Revised: 10/04/2013 Document Reviewed: 12/17/2012 [employer] Patient Information 2015 [employer]. This information is not intended to replace advice given to you by your health care provider. Make sure you discuss any questions you have with your health care provider.  

## 2014-08-24 NOTE — ED Notes (Addendum)
Pt reports coughing up white phlegm since Saturday with increased wheezing. Pt tx 3 weeks ago for similar symptoms- took antibiotics and prednisone and everything cleared up. Last used albuterol at 6am with improvement of wheezing. Lung sounds clear at present.

## 2016-03-28 ENCOUNTER — Telehealth (INDEPENDENT_AMBULATORY_CARE_PROVIDER_SITE_OTHER): Payer: Self-pay

## 2016-03-28 NOTE — Telephone Encounter (Signed)
Spoke with Dr. Kathleen Arguecervieri with disability office for pt.  Recommended based on prior one time Vocational rehab visit that light duty is appropriate but further diagnostic testing and possible intervention from Arthroscopy vs Total knee Arthroplasty could improve his ability to continue to work at a higher level

## 2016-03-28 NOTE — Telephone Encounter (Signed)
Dr. Kathleen Argueervieri called to discuss disability claim for patient Tony Glenn.  Cb# [phone]

## 2016-05-19 IMAGING — CR DG CHEST 2V
2 series · 2 of 2 positions shown · non-contrast
Comparison: 01/12/2013.

CLINICAL DATA: 53-year-old male with cough for 6 days. Shortness
breath. Upper back pain. History of asthma and hypertension.
Nonsmoker. Initial encounter.

EXAM:
CHEST  2 VIEW

[chest pa]
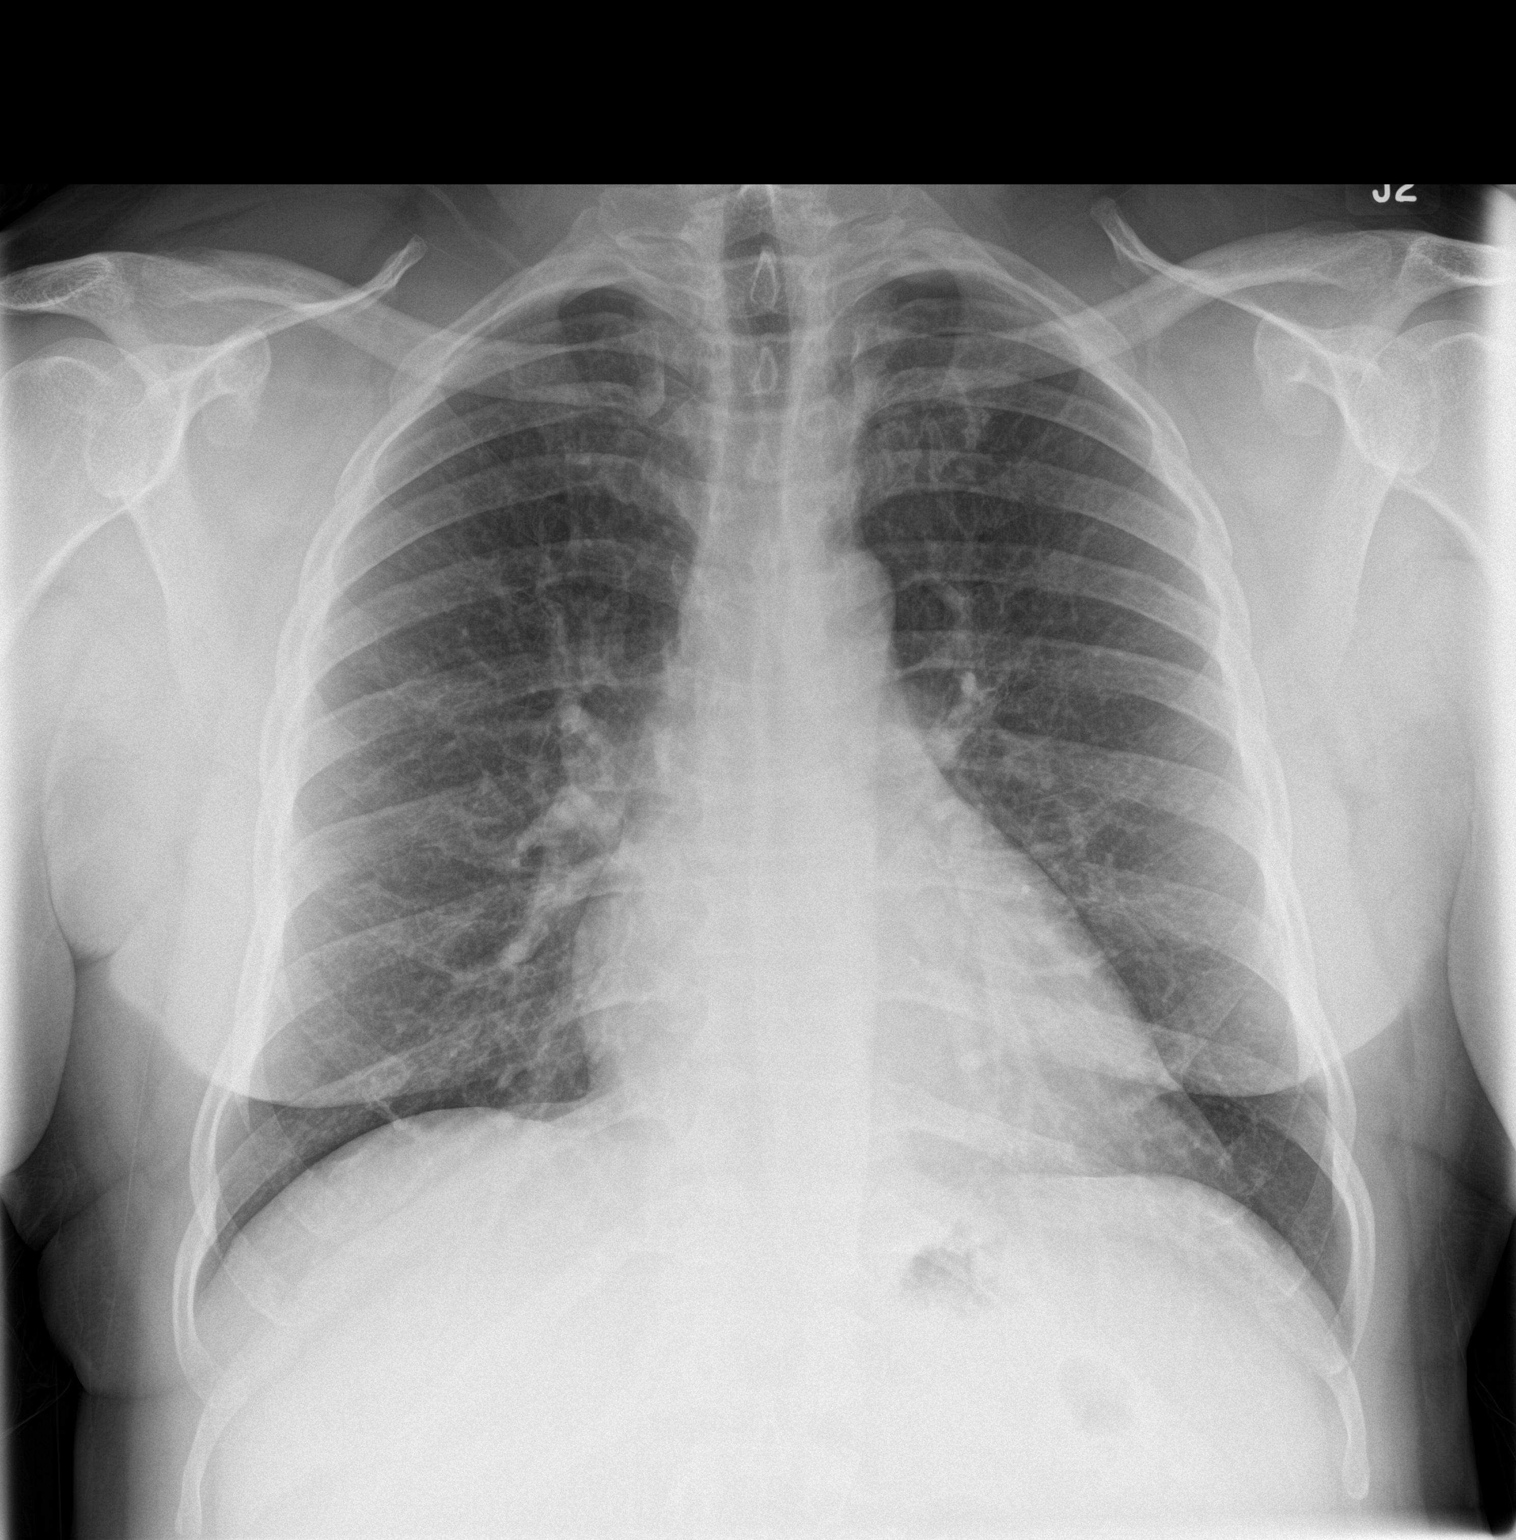

[chest lat]
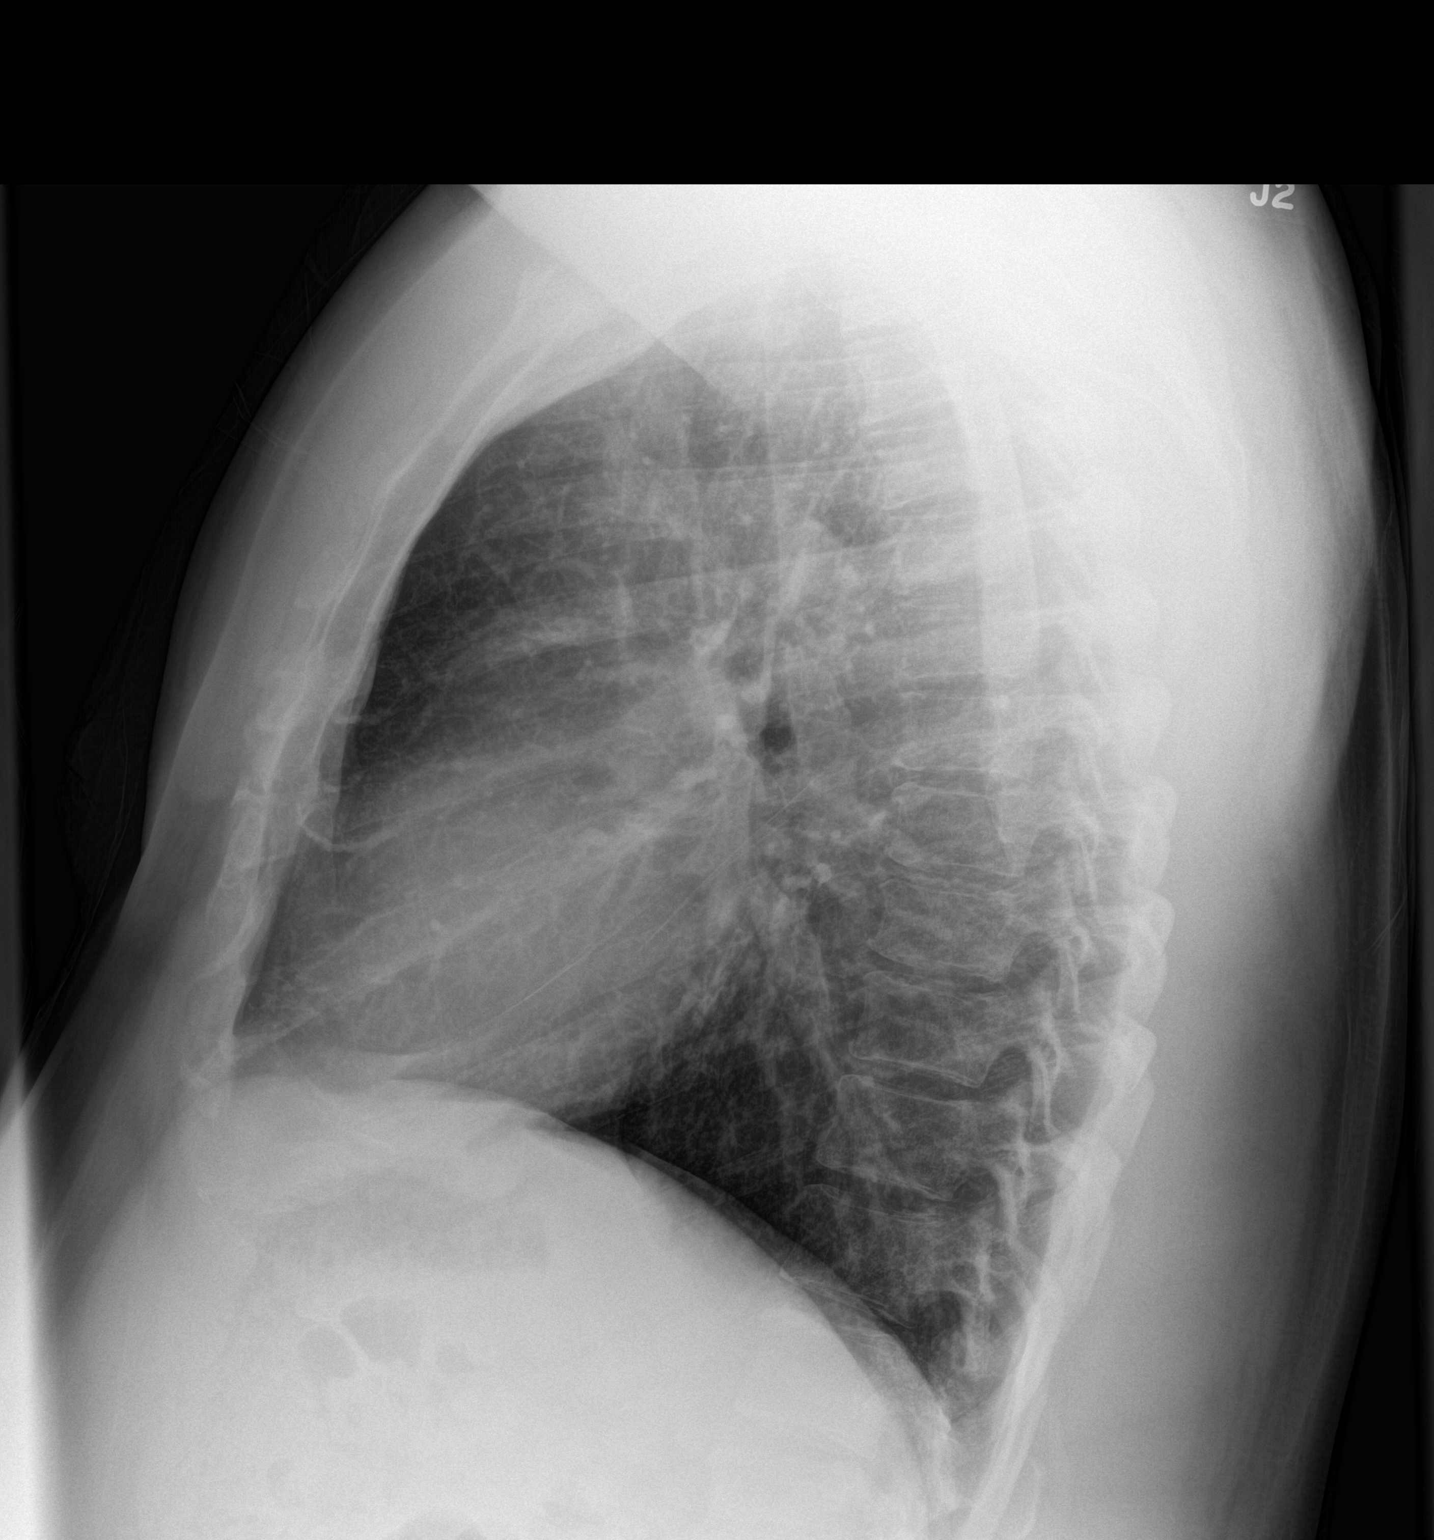

[2 of 2 positions shown; findings below may reference images not displayed]

FINDINGS: No infiltrate, congestive heart failure or pneumothorax.

Heart size top-normal.

No plain film evidence of pulmonary malignancy.

No osseous abnormality noted.
IMPRESSION: No active cardiopulmonary disease.
